# Patient Record
Sex: Female | Born: 1995
Health system: Southern US, Community
[De-identification: ages and names within clinical notes are randomized; demographics above are authoritative.]

## PROBLEM LIST (undated history)

## (undated) DIAGNOSIS — L309 Dermatitis, unspecified: Secondary | ICD-10-CM

## (undated) HISTORY — PX: TONSILLECTOMY: SUR1361

## (undated) HISTORY — DX: Dermatitis, unspecified: L30.9

---
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
## 2015-08-30 ENCOUNTER — Encounter (HOSPITAL_BASED_OUTPATIENT_CLINIC_OR_DEPARTMENT_OTHER): Payer: Self-pay | Admitting: *Deleted

## 2015-08-30 ENCOUNTER — Emergency Department (HOSPITAL_BASED_OUTPATIENT_CLINIC_OR_DEPARTMENT_OTHER)
Admission: EM | Admit: 2015-08-30 | Discharge: 2015-08-30 | Disposition: A | Discharge status: Home / Self Care | Payer: Commercial Managed Care - HMO | Attending: Emergency Medicine | Admitting: Emergency Medicine

## 2015-08-30 DIAGNOSIS — R112 Nausea with vomiting, unspecified: Secondary | ICD-10-CM | POA: Diagnosis not present

## 2015-08-30 DIAGNOSIS — R197 Diarrhea, unspecified: Secondary | ICD-10-CM | POA: Insufficient documentation

## 2015-08-30 DIAGNOSIS — K921 Melena: Secondary | ICD-10-CM | POA: Diagnosis present

## 2015-08-30 DIAGNOSIS — R195 Other fecal abnormalities: Secondary | ICD-10-CM | POA: Diagnosis not present

## 2015-08-30 LAB — OCCULT BLOOD X 1 CARD TO LAB, STOOL: FECAL OCCULT BLD: NEGATIVE

## 2015-08-30 MED ORDER — ONDANSETRON 8 MG PO TBDP
8.0000 mg | ORAL_TABLET | Freq: Once | ORAL | Status: AC
  Administered 2015-08-30: 8 mg via ORAL
  Filled 2015-08-30: qty 1

## 2015-08-30 NOTE — ED Notes (Addendum)
States she had diarrhea yesterday. Today she has had soft stools that look black. She vomiting 5 times yesterday but none today. Abdominal pain yesterday.

## 2015-08-30 NOTE — ED Provider Notes (Signed)
CSN: [id number]     Arrival date & time 08/30/15  1212 History   First MD Initiated Contact with Patient 08/30/15 1221     Chief Complaint  Patient presents with  . Blood In Stools     (Consider location/radiation/quality/duration/timing/severity/associated sxs/prior Treatment) HPI Hailey Beltran is a 19 y.o. female who presents to emergency department complaining of black stools. Patient states she had a GI virus yesterday, states she had nausea, vomiting, diarrhea, abdominal cramping. She states today her symptoms are improving. Stools are formed, soft, but appeared to be black. She went to urgent care and they told her she needed to go to emergency department because it may mean that she is bleeding. She states she is able to tolerate fluids today. She states her abdominal pain is gone today. She states overall she feels much better but is worried about her black stools. Patient does not take any iron supplements. She did take Pepto yesterday. She has no other medical problems.  History reviewed. No pertinent past medical history. Past Surgical History  Procedure Laterality Date  . Tonsillectomy     No family history on file. Social History  Substance Use Topics  . Smoking status: Never Smoker   . Smokeless tobacco: None  . Alcohol Use: Yes     Comment: occ   OB History    No data available     Review of Systems  Constitutional: Negative for fever and chills.  Respiratory: Negative for cough, chest tightness and shortness of breath.   Cardiovascular: Negative for chest pain, palpitations and leg swelling.  Gastrointestinal: Positive for nausea, vomiting, abdominal pain and diarrhea.  Musculoskeletal: Negative for myalgias, arthralgias, neck pain and neck stiffness.  Skin: Negative for rash.  Neurological: Negative for dizziness, weakness and headaches.  All other systems reviewed and are negative.     Allergies  Review of patient's allergies indicates no known  allergies.  Home Medications   Prior to Admission medications   Not on File   BP 123/87 mmHg  Pulse 87  Temp(Src) 98.1 F (36.7 C) (Oral)  Resp 20  Ht 5\' 1"  (1.549 m)  Wt 91.627 kg  BMI 38.19 kg/m2  SpO2 100%  LMP 08/02/2015 Physical Exam  Constitutional: She is oriented to person, place, and time. She appears well-developed and well-nourished. No distress.  HENT:  Head: Normocephalic.  Eyes: Conjunctivae are normal.  Neck: Neck supple.  Cardiovascular: Normal rate, regular rhythm and normal heart sounds.   Pulmonary/Chest: Effort normal and breath sounds normal. No respiratory distress. She has no wheezes. She has no rales.  Abdominal: Soft. Bowel sounds are normal. She exhibits no distension. There is no tenderness. There is no rebound.  Genitourinary: Guaiac negative stool.  Black stools  Musculoskeletal: She exhibits no edema.  Neurological: She is alert and oriented to person, place, and time.  Skin: Skin is warm and dry.  Psychiatric: She has a normal mood and affect. Her behavior is normal.  Nursing note and vitals reviewed.   ED Course  Procedures (including critical care time) Labs Review Labs Reviewed  OCCULT BLOOD X 1 CARD TO LAB, STOOL    Imaging Review No results found. I have personally reviewed and evaluated these images and lab results as part of my medical decision-making.   EKG Interpretation None      MDM   Final diagnoses:  Nausea vomiting and diarrhea  Black stools    patient emergency department with dark tarry stools. States she had viral  syndrome yesterday, however she is feeling much better today. Will check her stool for blood. zofran ordered.   1:18 PM Hemoccult negative. Black stools most likely from pepto bismal. Will dc home with outpatient follow up. Instructed to drink plenty of fluids. Return if worsening pain, bleeding, vomiting, fever, any new concerning symptom.   Filed Vitals:   08/30/15 1220 08/30/15 1231  BP:  128/86 123/87  Pulse: 85 87  Temp: 98.1 F (36.7 C)   TempSrc: Oral   Resp: 18 20  Height:  (1.549 m)   Weight: 91.627 kg   SpO2: 96% 100%     Jaynie Crumble, PA-C 08/30/15 2119  Raeford Razor, MD 09/02/15 [phone]

## 2015-08-30 NOTE — Discharge Instructions (Signed)
Drink plenty of fluids. Follow up with primary care doctor as needed. Return if fever, worsening abdominal pain, bright red blood in stool, vomiting, any new concerning symptom.   Norovirus Infection A norovirus infection is caused by exposure to a virus in a group of similar viruses (noroviruses). This type of infection causes inflammation in your stomach and intestines (gastroenteritis). Norovirus is the most common cause of gastroenteritis. It also causes food poisoning. Anyone can get a norovirus infection. It spreads very easily (contagious). You can get it from contaminated food, water, surfaces, or other people. Norovirus is found in the stool or vomit of infected people. You can spread the infection as soon as you feel sick until 2 weeks after you recover.  Symptoms usually begin within 2 days after you become infected. Most norovirus symptoms affect the digestive system. CAUSES Norovirus infection is caused by contact with norovirus. You can catch norovirus if you:  Eat or drink something contaminated with norovirus.  Touch surfaces or objects contaminated with norovirus and then put your hand in your mouth.  Have direct contact with an infected person who has symptoms.  Share food, drink, or utensils with someone with who is sick with norovirus. SIGNS AND SYMPTOMS Symptoms of norovirus may include:  Nausea.  Vomiting.  Diarrhea.  Stomach cramps.  Fever.  Chills.  Headache.  Muscle aches.  Tiredness. DIAGNOSIS Your health care provider may suspect norovirus based on your symptoms and physical exam. Your health care provider may also test a sample of your stool or vomit for the virus.  TREATMENT There is no specific treatment for norovirus. Most people get better without treatment in about 2 days. HOME CARE INSTRUCTIONS  Replace lost fluids by drinking plenty of water or rehydration fluids containing important minerals called electrolytes. This prevents dehydration.  Drink enough fluid to keep your urine clear or pale yellow.  Do not prepare food for others while you are infected. Wait at least 3 days after recovering from the illness to do that. PREVENTION   Wash your hands often, especially after using the toilet or changing a diaper.  Wash fruits and vegetables thoroughly before preparing or serving them.  Throw out any food that a sick person may have touched.  Disinfect contaminated surfaces immediately after someone in the household has been sick. Use a bleach-based household cleaner.  Immediately remove and wash soiled clothes or sheets. SEEK MEDICAL CARE IF:  Your vomiting, diarrhea, and stomach pain is getting worse.  Your symptoms of norovirus do not go away after 2-3 days. SEEK IMMEDIATE MEDICAL CARE IF:  You develop symptoms of dehydration that do not improve with fluid replacement. This may include:  Excessive sleepiness.  Lack of tears.  Dry mouth.  Dizziness when standing.  Weak pulse.   This information is not intended to replace advice given to you by your health care provider. Make sure you discuss any questions you have with your health care provider.   Document Released: 11/08/2002 Document Revised: 09/08/2014 Document Reviewed: 01/26/2014 Elsevier Interactive Patient Education [employer]

## 2016-12-05 ENCOUNTER — Other Ambulatory Visit (HOSPITAL_COMMUNITY)
Admission: RE | Admit: 2016-12-05 | Discharge: 2016-12-05 | Disposition: A | Discharge status: Home / Self Care | Payer: Commercial Managed Care - HMO | Source: Ambulatory Visit | Attending: Family Medicine | Admitting: Family Medicine

## 2016-12-05 ENCOUNTER — Other Ambulatory Visit: Payer: Self-pay | Admitting: Family Medicine

## 2016-12-05 DIAGNOSIS — Z01411 Encounter for gynecological examination (general) (routine) with abnormal findings: Secondary | ICD-10-CM | POA: Diagnosis not present

## 2016-12-05 DIAGNOSIS — Z01419 Encounter for gynecological examination (general) (routine) without abnormal findings: Secondary | ICD-10-CM | POA: Diagnosis not present

## 2016-12-05 DIAGNOSIS — Z13220 Encounter for screening for lipoid disorders: Secondary | ICD-10-CM | POA: Diagnosis not present

## 2016-12-05 DIAGNOSIS — Z131 Encounter for screening for diabetes mellitus: Secondary | ICD-10-CM | POA: Diagnosis not present

## 2016-12-05 DIAGNOSIS — Z23 Encounter for immunization: Secondary | ICD-10-CM | POA: Diagnosis not present

## 2016-12-10 LAB — CYTOLOGY - PAP: DIAGNOSIS: NEGATIVE

## 2017-05-24 DIAGNOSIS — Y999 Unspecified external cause status: Secondary | ICD-10-CM | POA: Insufficient documentation

## 2017-05-24 DIAGNOSIS — S46912A Strain of unspecified muscle, fascia and tendon at shoulder and upper arm level, left arm, initial encounter: Secondary | ICD-10-CM | POA: Diagnosis not present

## 2017-05-24 DIAGNOSIS — Y9289 Other specified places as the place of occurrence of the external cause: Secondary | ICD-10-CM | POA: Diagnosis not present

## 2017-05-24 DIAGNOSIS — Y9301 Activity, walking, marching and hiking: Secondary | ICD-10-CM | POA: Diagnosis not present

## 2017-05-24 DIAGNOSIS — M25512 Pain in left shoulder: Secondary | ICD-10-CM | POA: Diagnosis not present

## 2017-05-24 DIAGNOSIS — S4992XA Unspecified injury of left shoulder and upper arm, initial encounter: Secondary | ICD-10-CM | POA: Diagnosis not present

## 2017-05-24 DIAGNOSIS — X500XXA Overexertion from strenuous movement or load, initial encounter: Secondary | ICD-10-CM | POA: Diagnosis not present

## 2017-05-25 ENCOUNTER — Emergency Department (HOSPITAL_COMMUNITY)
Admission: EM | Admit: 2017-05-25 | Discharge: 2017-05-25 | Disposition: A | Discharge status: Home / Self Care | Payer: 59 | Attending: Emergency Medicine | Admitting: Emergency Medicine

## 2017-05-25 ENCOUNTER — Emergency Department (HOSPITAL_COMMUNITY): Payer: 59

## 2017-05-25 ENCOUNTER — Encounter (HOSPITAL_COMMUNITY): Payer: Self-pay | Admitting: *Deleted

## 2017-05-25 DIAGNOSIS — S46912A Strain of unspecified muscle, fascia and tendon at shoulder and upper arm level, left arm, initial encounter: Secondary | ICD-10-CM

## 2017-05-25 DIAGNOSIS — M25512 Pain in left shoulder: Secondary | ICD-10-CM | POA: Diagnosis not present

## 2017-05-25 MED ORDER — IBUPROFEN 200 MG PO TABS
600.0000 mg | ORAL_TABLET | Freq: Once | ORAL | Status: AC
  Administered 2017-05-25: 600 mg via ORAL
  Filled 2017-05-25: qty 3

## 2017-05-25 MED ORDER — IBUPROFEN 600 MG PO TABS: 600.0000 mg | ORAL_TABLET | Freq: Four times a day (QID) | ORAL | 0 refills | Status: DC | PRN

## 2017-05-25 NOTE — Discharge Instructions (Signed)
Follow up with your doctor if pain is no better in a week. Return here as needed.

## 2017-05-25 NOTE — ED Notes (Signed)
Bed: WTR8 Expected date:  Expected time:  Means of arrival:  Comments: 

## 2017-05-25 NOTE — ED Provider Notes (Signed)
[facility]-EMERGENCY DEPT Provider Note   CSN: [id number] Arrival date & time: 05/24/17  2128     History   Chief Complaint Chief Complaint  Patient presents with  . Shoulder Pain    left    HPI Hailey Beltran is a 21 y.o. female.  Left shoulder injury yesterday (05/23/17) while walking her dog. The dog pulled away from her suddenly, causing a pull on the shoulder. She has had pain in the joint and numbness in the arm since. No other injury. No perceived weakness of the left UE. No neck pain, chest pain, SOB.    The history is provided by the patient. No language interpreter was used.    History reviewed. No pertinent past medical history.  There are no active problems to display for this patient.   Past Surgical History:  Procedure Laterality Date  . TONSILLECTOMY      OB History    No data available       Home Medications    Prior to Admission medications   Not on File    Family History No family history on file.  Social History Social History  Substance Use Topics  . Smoking status: Never Smoker  . Smokeless tobacco: Never Used  . Alcohol use Yes     Comment: occ     Allergies   Patient has no known allergies.   Review of Systems Review of Systems  Respiratory: Negative.  Negative for shortness of breath.   Cardiovascular: Negative.  Negative for chest pain.  Gastrointestinal: Negative.  Negative for abdominal pain and nausea.  Musculoskeletal:       See HPI.  Skin: Negative.   Neurological: Positive for numbness.     Physical Exam Updated Vital Signs BP (!) 161/100 (BP Location: Left Arm)   Pulse 70   Temp 98.9 F (37.2 C) (Oral)   Resp 18   Ht  (1.549 m)   Wt 86.2 kg (190 lb)   LMP 05/24/2017 (Exact Date)   SpO2 99%   BMI 35.90 kg/m   Physical Exam  Constitutional: She is oriented to person, place, and time. She appears well-developed and well-nourished.  Neck: Normal range of motion.  Cardiovascular: Intact distal  pulses.   Pulmonary/Chest: Effort normal.  Musculoskeletal:  Left shoulder unremarkable in appearance. No swelling, deformity or discoloration. FROM. Focal tenderness to anterior deltoid area.   Neurological: She is alert and oriented to person, place, and time. A sensory deficit (Slight decrease in sensation to light touch of left arm when compared to right.) is present.  Skin: Skin is warm and dry.     ED Treatments / Results  Labs (all labs ordered are listed, but only abnormal results are displayed) Labs Reviewed - No data to display  EKG  EKG Interpretation None       Radiology Dg Shoulder Left  Result Date: 05/25/2017 CLINICAL DATA:  Left shoulder pain after her dog jerked the leash while they were walking. EXAM: LEFT SHOULDER - 2+ VIEW COMPARISON:  None. FINDINGS: There is no evidence of fracture or dislocation. There is no evidence of arthropathy or other focal bone abnormality. Soft tissues are unremarkable. IMPRESSION: No acute abnormality of the left shoulder. Electronically Signed   By: Deatra Robinson M.D.   On: 05/25/2017 00:47    Procedures Procedures (including critical care time)  Medications Ordered in ED Medications - No data to display   Initial Impression / Assessment and Plan / ED Course  I  have reviewed the triage vital signs and the nursing notes.  Pertinent labs & imaging results that were available during my care of the patient were reviewed by me and considered in my medical decision making (see chart for details).     Left shoulder strain injury after her dog pulled away from her while holding leash in left hand. Exam and history supports strain injury only.   Final Clinical Impressions(s) / ED Diagnoses   Final diagnoses:  None   1. Left shoulder strain  New Prescriptions New Prescriptions   No medications on file     Elpidio Anis, Cordelia Poche 05/25/17 Arlie Solomons, MD 05/25/17 [phone]

## 2017-05-25 NOTE — ED Triage Notes (Signed)
Pt reports that she was walking her dog earlier when the dog pulled on her left arm.  Pt reports something pop and now she feels numb in her arm. No obvious deformity noted in triage.  Pt reports limited movement d/t pain.  Pt has a +2 left radial pulse and a cap refill of less than 3 seconds. Pt a/o x 4 and ambulatory.

## 2017-06-24 DIAGNOSIS — B351 Tinea unguium: Secondary | ICD-10-CM | POA: Diagnosis not present

## 2017-11-29 DIAGNOSIS — J101 Influenza due to other identified influenza virus with other respiratory manifestations: Secondary | ICD-10-CM | POA: Diagnosis not present

## 2017-11-29 DIAGNOSIS — R05 Cough: Secondary | ICD-10-CM | POA: Diagnosis not present

## 2017-12-07 DIAGNOSIS — M940 Chondrocostal junction syndrome [Tietze]: Secondary | ICD-10-CM | POA: Diagnosis not present

## 2017-12-07 DIAGNOSIS — Z13220 Encounter for screening for lipoid disorders: Secondary | ICD-10-CM | POA: Diagnosis not present

## 2017-12-07 DIAGNOSIS — Z0000 Encounter for general adult medical examination without abnormal findings: Secondary | ICD-10-CM | POA: Diagnosis not present

## 2018-03-30 DIAGNOSIS — Z3202 Encounter for pregnancy test, result negative: Secondary | ICD-10-CM | POA: Diagnosis not present

## 2018-03-30 DIAGNOSIS — J309 Allergic rhinitis, unspecified: Secondary | ICD-10-CM | POA: Diagnosis not present

## 2018-03-30 DIAGNOSIS — K219 Gastro-esophageal reflux disease without esophagitis: Secondary | ICD-10-CM | POA: Diagnosis not present

## 2018-11-02 ENCOUNTER — Other Ambulatory Visit: Payer: Self-pay

## 2018-11-02 ENCOUNTER — Encounter (HOSPITAL_COMMUNITY): Payer: Self-pay | Admitting: Emergency Medicine

## 2018-11-02 ENCOUNTER — Emergency Department (HOSPITAL_COMMUNITY)
Admission: EM | Admit: 2018-11-02 | Discharge: 2018-11-02 | Disposition: A | Discharge status: Home / Self Care | Payer: 59 | Attending: Emergency Medicine | Admitting: Emergency Medicine

## 2018-11-02 DIAGNOSIS — N938 Other specified abnormal uterine and vaginal bleeding: Secondary | ICD-10-CM | POA: Insufficient documentation

## 2018-11-02 DIAGNOSIS — R102 Pelvic and perineal pain: Secondary | ICD-10-CM | POA: Insufficient documentation

## 2018-11-02 DIAGNOSIS — N939 Abnormal uterine and vaginal bleeding, unspecified: Secondary | ICD-10-CM

## 2018-11-02 DIAGNOSIS — Z3202 Encounter for pregnancy test, result negative: Secondary | ICD-10-CM | POA: Diagnosis not present

## 2018-11-02 LAB — CBC WITH DIFFERENTIAL/PLATELET
ABS IMMATURE GRANULOCYTES: 0.03 10*3/uL (ref 0.00–0.07)
Basophils Absolute: 0 10*3/uL (ref 0.0–0.1)
Basophils Relative: 0 %
EOS PCT: 1 %
Eosinophils Absolute: 0.1 10*3/uL (ref 0.0–0.5)
HEMATOCRIT: 41.2 % (ref 36.0–46.0)
HEMOGLOBIN: 12.8 g/dL (ref 12.0–15.0)
Immature Granulocytes: 0 %
LYMPHS ABS: 3.6 10*3/uL (ref 0.7–4.0)
LYMPHS PCT: 30 %
MCH: 27.2 pg (ref 26.0–34.0)
MCHC: 31.1 g/dL (ref 30.0–36.0)
MCV: 87.5 fL (ref 80.0–100.0)
MONO ABS: 0.6 10*3/uL (ref 0.1–1.0)
MONOS PCT: 5 %
NEUTROS ABS: 7.8 10*3/uL — AB (ref 1.7–7.7)
Neutrophils Relative %: 64 %
PLATELETS: 308 10*3/uL (ref 150–400)
RBC: 4.71 MIL/uL (ref 3.87–5.11)
RDW: 13.6 % (ref 11.5–15.5)
WBC: 12.1 10*3/uL — AB (ref 4.0–10.5)
nRBC: 0 % (ref 0.0–0.2)

## 2018-11-02 LAB — POC URINE PREG, ED: Preg Test, Ur: NEGATIVE

## 2018-11-02 LAB — I-STAT BETA HCG BLOOD, ED (MC, WL, AP ONLY)

## 2018-11-02 MED ORDER — HYDROCODONE-ACETAMINOPHEN 5-325 MG PO TABS: 1.0000 | ORAL_TABLET | ORAL | 0 refills | Status: DC | PRN

## 2018-11-02 MED ORDER — HYDROCODONE-ACETAMINOPHEN 5-325 MG PO TABS
1.0000 | ORAL_TABLET | Freq: Once | ORAL | Status: AC
  Administered 2018-11-02: 1 via ORAL
  Filled 2018-11-02: qty 1

## 2018-11-02 NOTE — ED Notes (Signed)
Urine sample and urine culture sent to lab PRN

## 2018-11-02 NOTE — ED Triage Notes (Signed)
PT BIB POV c/o abdominal pain and vaginal bleeding x2 days. Pt stated that she will go through a super sized tampon in 30 minutes. Pt endorses nausea also. Cramping in abdomen is worse then with a normal cycle. Blood is dark in color and clots are present.

## 2018-11-02 NOTE — Discharge Instructions (Signed)
The testing today is reassuring.  Continue to take an anti-inflammatory like ibuprofen.  We are prescribing a narcotic pain reliever.  Do not drive when taking the narcotic pain reliever.

## 2018-11-02 NOTE — ED Provider Notes (Signed)
[facility]-EMERGENCY DEPT Provider Note   CSN: [id number] Arrival date & time: 11/02/18  1751    History   Chief Complaint Chief Complaint  Patient presents with  . Abdominal Pain  . Vaginal Bleeding    HPI Hailey Beltran is a 23 y.o. female.     HPI   She presents for evaluation of heavy menses, which started yesterday.  She is taking Tylenol, ibuprofen, and Midol, all without relief of her cramping abdominal pain.  She states her last period, 1 month ago, was lighter than usual and she had some spotting throughout the last month.  She does not think that she is pregnant.  She denies weakness, dizziness, fever, chills, nausea or vomiting.  There are no other known modifying factors.  History reviewed. No pertinent past medical history.  There are no active problems to display for this patient.   Past Surgical History:  Procedure Laterality Date  . TONSILLECTOMY       OB History   No obstetric history on file.      Home Medications    Prior to Admission medications   Medication Sig Start Date End Date Taking? Authorizing Provider  ibuprofen (ADVIL,MOTRIN) 200 MG tablet Take 400 mg by mouth 2 (two) times daily as needed for cramping.   Yes [provider]  Multiple Vitamins-Calcium (ONE-A-DAY WITHIN PO) Take 1 tablet by mouth daily.   Yes [provider]  HYDROcodone-acetaminophen (NORCO/VICODIN) 5-325 MG tablet Take 1 tablet by mouth every 4 (four) hours as needed for moderate pain. 11/02/18   Mancel Bale, MD  ibuprofen (ADVIL,MOTRIN) 600 MG tablet Take 1 tablet (600 mg total) by mouth every 6 (six) hours as needed. Patient not taking: Reported on 11/02/2018 05/25/17   Elpidio Anis, PA-C    Family History History reviewed. No pertinent family history.  Social History Social History   Tobacco Use  . Smoking status: Never Smoker  . Smokeless tobacco: Never Used  Substance Use Topics  . Alcohol use: Yes    Comment:  occ  . Drug use: No     Allergies   Patient has no known allergies.   Review of Systems Review of Systems  All other systems reviewed and are negative.    Physical Exam Updated Vital Signs BP (!) 145/89 (BP Location: Right Arm)   Pulse 83   Temp 98.2 F (36.8 C) (Oral)   Resp 18   Ht  (1.549 m)   Wt 99.8 kg   SpO2 99%   BMI 41.57 kg/m   Physical Exam Vitals signs and nursing note reviewed.  Constitutional:      General: She is not in acute distress.    Appearance: She is well-developed. She is not ill-appearing, toxic-appearing or diaphoretic.  HENT:     Head: Normocephalic and atraumatic.     Right Ear: External ear normal.     Left Ear: External ear normal.     Mouth/Throat:     Mouth: Mucous membranes are moist.  Eyes:     Conjunctiva/sclera: Conjunctivae normal.     Pupils: Pupils are equal, round, and reactive to light.  Neck:     Musculoskeletal: Normal range of motion and neck supple.     Trachea: Phonation normal.  Cardiovascular:     Rate and Rhythm: Normal rate and regular rhythm.     Heart sounds: Normal heart sounds.  Pulmonary:     Effort: Pulmonary effort is normal.     Breath  sounds: Normal breath sounds.  Abdominal:     General: There is no distension.     Palpations: Abdomen is soft. There is no mass.     Tenderness: There is abdominal tenderness (Pelvic, mild). There is no rebound.     Hernia: No hernia is present.  Musculoskeletal: Normal range of motion.  Skin:    General: Skin is warm and dry.  Neurological:     Mental Status: She is alert and oriented to person, place, and time.     Cranial Nerves: No cranial nerve deficit.     Sensory: No sensory deficit.     Motor: No abnormal muscle tone.     Coordination: Coordination normal.  Psychiatric:        Mood and Affect: Mood normal.        Behavior: Behavior normal.        Thought Content: Thought content normal.        Judgment: Judgment normal.      ED Treatments /  Results  Labs (all labs ordered are listed, but only abnormal results are displayed) Labs Reviewed  CBC WITH DIFFERENTIAL/PLATELET - Abnormal; Notable for the following components:      Result Value   WBC 12.1 (*)    Neutro Abs 7.8 (*)    All other components within normal limits  I-STAT BETA HCG BLOOD, ED ([facility], [facility], [facility] ONLY)  POC URINE PREG, ED  I-STAT BETA HCG BLOOD, ED ([facility], [facility], [facility] ONLY)    EKG None  Radiology No results found.  Procedures Procedures (including critical care time)  Medications Ordered in ED Medications  HYDROcodone-acetaminophen (NORCO/VICODIN) 5-325 MG per tablet 1 tablet (has no administration in time range)     Initial Impression / Assessment and Plan / ED Course  I have reviewed the triage vital signs and the nursing notes.  Pertinent labs & imaging results that were available during my care of the patient were reviewed by me and considered in my medical decision making (see chart for details).         Patient Vitals for the past 24 hrs:  BP Temp Temp src Pulse Resp SpO2 Height Weight  11/02/18 2054 (!) 145/89 - - 83 18 99 % - -  11/02/18 1820 (!) 144/96 98.2 F (36.8 C) Oral 78 14 97 % 5\' 1"  (1.549 m) 99.8 kg    9:59 PM Reevaluation with update and discussion. After initial assessment and treatment, an updated evaluation reveals no further complaints, findings discussed with the patient and a female friend with her, all questions were answered. Mancel Bale   Medical Decision Making: Eval. C/w dysmenorrhea. Not pregnant. Hemodynamically stable and reassuring Hemoglobin.  CRITICAL CARE- No Performed by: Mancel Bale  Nursing Notes Reviewed/ Care Coordinated Applicable Imaging Reviewed Interpretation of Laboratory Data incorporated into ED treatment  The patient appears reasonably screened and/or stabilized for discharge and I doubt any other medical condition or other [facility] requiring further screening, evaluation, or treatment in the ED at  this time prior to discharge.  Plan: Home Medications- OTC prn; Home Treatments- rest, fluids; return here if the recommended treatment, does not improve the symptoms; Recommended follow up- PCP/GYN prn   Final Clinical Impressions(s) / ED Diagnoses   Final diagnoses:  Vaginal bleeding    ED Discharge Orders         Ordered    HYDROcodone-acetaminophen (NORCO/VICODIN) 5-325 MG tablet  Every 4 hours PRN     11/02/18 2157  Mancel Bale, MD 11/03/18 1116

## 2019-10-21 ENCOUNTER — Encounter (HOSPITAL_COMMUNITY): Payer: Self-pay | Admitting: Emergency Medicine

## 2019-10-21 ENCOUNTER — Emergency Department (HOSPITAL_COMMUNITY)
Admission: EM | Admit: 2019-10-21 | Discharge: 2019-10-21 | Disposition: A | Discharge status: Home / Self Care | Payer: 59 | Attending: Emergency Medicine | Admitting: Emergency Medicine

## 2019-10-21 ENCOUNTER — Other Ambulatory Visit: Payer: Self-pay

## 2019-10-21 DIAGNOSIS — L299 Pruritus, unspecified: Secondary | ICD-10-CM | POA: Diagnosis present

## 2019-10-21 DIAGNOSIS — L249 Irritant contact dermatitis, unspecified cause: Secondary | ICD-10-CM | POA: Insufficient documentation

## 2019-10-21 MED ORDER — FAMOTIDINE 20 MG PO TABS: 20.0000 mg | ORAL_TABLET | Freq: Two times a day (BID) | ORAL | 0 refills | Status: DC

## 2019-10-21 NOTE — ED Provider Notes (Signed)
[facility]-EMERGENCY DEPT Provider Note   CSN: [id number] Arrival date & time: 10/21/19  2043     History Chief Complaint  Patient presents with  . Finger Injury    Hailey Beltran is a 24 y.o. female.  HPI Patient presents to the emergency department with irritation to the left index finger that started 2 hours ago.  The patient states that she was at work when she noticed that after taking off her glove.  Patient states that the area itched and there is some swelling noted to the area.  The areas on the palmar aspect of the finger.  Patient does not recall any contact with any chemicals or other substance that would cause this.    History reviewed. No pertinent past medical history.  There are no problems to display for this patient.   Past Surgical History:  Procedure Laterality Date  . TONSILLECTOMY       OB History   No obstetric history on file.     No family history on file.  Social History   Tobacco Use  . Smoking status: Never Smoker  . Smokeless tobacco: Never Used  Substance Use Topics  . Alcohol use: Yes    Comment: occ  . Drug use: No    Home Medications Prior to Admission medications   Medication Sig Start Date End Date Taking? Authorizing Provider  HYDROcodone-acetaminophen (NORCO/VICODIN) 5-325 MG tablet Take 1 tablet by mouth every 4 (four) hours as needed for moderate pain. 11/02/18   Mancel Bale, MD  ibuprofen (ADVIL,MOTRIN) 200 MG tablet Take 400 mg by mouth 2 (two) times daily as needed for cramping.    [provider]  ibuprofen (ADVIL,MOTRIN) 600 MG tablet Take 1 tablet (600 mg total) by mouth every 6 (six) hours as needed. Patient not taking: Reported on 11/02/2018 05/25/17   Elpidio Anis, PA-C  Multiple Vitamins-Calcium (ONE-A-DAY WITHIN PO) Take 1 tablet by mouth daily.    [provider]    Allergies    Patient has no known allergies.  Review of Systems   Review of Systems All other  systems negative except as documented in the HPI. All pertinent positives and negatives as reviewed in the HPI. Physical Exam Updated Vital Signs BP (!) 166/99   Pulse 93   Temp 98.8 F (37.1 C) (Oral)   Resp 18   SpO2 98%   Physical Exam Vitals and nursing note reviewed.  Constitutional:      General: She is not in acute distress.    Appearance: She is well-developed.  HENT:     Head: Normocephalic and atraumatic.  Eyes:     Pupils: Pupils are equal, round, and reactive to light.  Pulmonary:     Effort: Pulmonary effort is normal.  Musculoskeletal:       Hands:  Skin:    General: Skin is warm and dry.  Neurological:     Mental Status: She is alert and oriented to person, place, and time.     ED Results / Procedures / Treatments   Labs (all labs ordered are listed, but only abnormal results are displayed) Labs Reviewed - No data to display  EKG None  Radiology No results found.  Procedures Procedures (including critical care time)  Medications Ordered in ED Medications - No data to display  ED Course  I have reviewed the triage vital signs and the nursing notes.  Pertinent labs & imaging results that were available during my care of the  patient were reviewed by me and considered in my medical decision making (see chart for details).    MDM Rules/Calculators/A&P                      The patient's itching along with the appearance of the finger this appears to be a contact type dermatitis.  I advised the patient at this point that is what we will work with him that if there is a change or worsening in her condition she will need to return here or follow-up with her primary care doctor. Final Clinical Impression(s) / ED Diagnoses Final diagnoses:  None    Rx / DC Orders ED Discharge Orders    None       Rebeca Allegra 10/21/19 2201    Hayden Rasmussen, MD 10/22/19 1032

## 2019-10-21 NOTE — Discharge Instructions (Addendum)
Return here for any worsening in your condition.  You can take Benadryl for any itching.

## 2019-10-21 NOTE — ED Triage Notes (Signed)
Patient reports swelling to left index finger x2 hours. ROM present.

## 2019-12-15 ENCOUNTER — Other Ambulatory Visit (HOSPITAL_COMMUNITY)
Admission: RE | Admit: 2019-12-15 | Discharge: 2019-12-15 | Disposition: A | Discharge status: Home / Self Care | Payer: BC Managed Care – PPO | Source: Ambulatory Visit | Attending: Family Medicine | Admitting: Family Medicine

## 2019-12-15 ENCOUNTER — Other Ambulatory Visit: Payer: Self-pay | Admitting: Family Medicine

## 2019-12-15 DIAGNOSIS — R079 Chest pain, unspecified: Secondary | ICD-10-CM | POA: Diagnosis not present

## 2019-12-15 DIAGNOSIS — Z0000 Encounter for general adult medical examination without abnormal findings: Secondary | ICD-10-CM | POA: Diagnosis not present

## 2019-12-15 DIAGNOSIS — Z113 Encounter for screening for infections with a predominantly sexual mode of transmission: Secondary | ICD-10-CM | POA: Diagnosis not present

## 2019-12-15 DIAGNOSIS — Z124 Encounter for screening for malignant neoplasm of cervix: Secondary | ICD-10-CM | POA: Insufficient documentation

## 2019-12-15 DIAGNOSIS — Z1159 Encounter for screening for other viral diseases: Secondary | ICD-10-CM | POA: Diagnosis not present

## 2019-12-15 DIAGNOSIS — Z13220 Encounter for screening for lipoid disorders: Secondary | ICD-10-CM | POA: Diagnosis not present

## 2019-12-16 ENCOUNTER — Ambulatory Visit
Admission: RE | Admit: 2019-12-16 | Discharge: 2019-12-16 | Disposition: A | Discharge status: Home / Self Care | Payer: BC Managed Care – PPO | Source: Ambulatory Visit | Attending: Family Medicine | Admitting: Family Medicine

## 2019-12-16 ENCOUNTER — Other Ambulatory Visit: Payer: Self-pay

## 2019-12-16 ENCOUNTER — Other Ambulatory Visit: Payer: Self-pay | Admitting: Family Medicine

## 2019-12-16 DIAGNOSIS — R079 Chest pain, unspecified: Secondary | ICD-10-CM | POA: Diagnosis not present

## 2019-12-29 LAB — CYTOLOGY - PAP
Comment: NEGATIVE
Diagnosis: UNDETERMINED — AB
High risk HPV: NEGATIVE

## 2020-01-15 DIAGNOSIS — H6692 Otitis media, unspecified, left ear: Secondary | ICD-10-CM | POA: Diagnosis not present

## 2020-02-21 DIAGNOSIS — M79641 Pain in right hand: Secondary | ICD-10-CM | POA: Diagnosis not present

## 2020-03-03 DIAGNOSIS — R21 Rash and other nonspecific skin eruption: Secondary | ICD-10-CM | POA: Diagnosis not present

## 2020-03-15 DIAGNOSIS — M79641 Pain in right hand: Secondary | ICD-10-CM | POA: Diagnosis not present

## 2020-03-15 DIAGNOSIS — M25531 Pain in right wrist: Secondary | ICD-10-CM | POA: Diagnosis not present

## 2020-03-28 DIAGNOSIS — M79641 Pain in right hand: Secondary | ICD-10-CM | POA: Diagnosis not present

## 2020-04-05 DIAGNOSIS — M79641 Pain in right hand: Secondary | ICD-10-CM | POA: Diagnosis not present

## 2020-06-01 DIAGNOSIS — G5601 Carpal tunnel syndrome, right upper limb: Secondary | ICD-10-CM | POA: Diagnosis not present

## 2020-06-11 DIAGNOSIS — R200 Anesthesia of skin: Secondary | ICD-10-CM | POA: Diagnosis not present

## 2020-06-19 DIAGNOSIS — M79641 Pain in right hand: Secondary | ICD-10-CM | POA: Diagnosis not present

## 2020-06-22 DIAGNOSIS — H6502 Acute serous otitis media, left ear: Secondary | ICD-10-CM | POA: Diagnosis not present

## 2020-06-27 DIAGNOSIS — H9203 Otalgia, bilateral: Secondary | ICD-10-CM | POA: Diagnosis not present

## 2020-06-27 DIAGNOSIS — Z20822 Contact with and (suspected) exposure to covid-19: Secondary | ICD-10-CM | POA: Diagnosis not present

## 2020-06-27 DIAGNOSIS — R5383 Other fatigue: Secondary | ICD-10-CM | POA: Diagnosis not present

## 2020-06-27 DIAGNOSIS — R0981 Nasal congestion: Secondary | ICD-10-CM | POA: Diagnosis not present

## 2020-07-04 DIAGNOSIS — J0111 Acute recurrent frontal sinusitis: Secondary | ICD-10-CM | POA: Diagnosis not present

## 2020-07-04 DIAGNOSIS — H6982 Other specified disorders of Eustachian tube, left ear: Secondary | ICD-10-CM | POA: Diagnosis not present

## 2020-07-05 DIAGNOSIS — R112 Nausea with vomiting, unspecified: Secondary | ICD-10-CM | POA: Diagnosis not present

## 2020-09-14 DIAGNOSIS — J029 Acute pharyngitis, unspecified: Secondary | ICD-10-CM | POA: Diagnosis not present

## 2020-09-14 DIAGNOSIS — U071 COVID-19: Secondary | ICD-10-CM | POA: Diagnosis not present

## 2020-12-26 ENCOUNTER — Other Ambulatory Visit: Payer: Self-pay | Admitting: Family Medicine

## 2020-12-26 ENCOUNTER — Other Ambulatory Visit (HOSPITAL_COMMUNITY)
Admission: RE | Admit: 2020-12-26 | Discharge: 2020-12-26 | Disposition: A | Discharge status: Home / Self Care | Payer: BC Managed Care – PPO | Source: Ambulatory Visit | Attending: Family Medicine | Admitting: Family Medicine

## 2020-12-26 DIAGNOSIS — Z113 Encounter for screening for infections with a predominantly sexual mode of transmission: Secondary | ICD-10-CM | POA: Diagnosis not present

## 2020-12-26 DIAGNOSIS — Z124 Encounter for screening for malignant neoplasm of cervix: Secondary | ICD-10-CM | POA: Insufficient documentation

## 2020-12-26 DIAGNOSIS — Z13220 Encounter for screening for lipoid disorders: Secondary | ICD-10-CM | POA: Diagnosis not present

## 2020-12-26 DIAGNOSIS — Z8742 Personal history of other diseases of the female genital tract: Secondary | ICD-10-CM | POA: Insufficient documentation

## 2020-12-26 DIAGNOSIS — Z0000 Encounter for general adult medical examination without abnormal findings: Secondary | ICD-10-CM | POA: Diagnosis not present

## 2021-01-01 LAB — CYTOLOGY - PAP
Comment: NEGATIVE
Diagnosis: NEGATIVE
High risk HPV: NEGATIVE

## 2021-01-29 DIAGNOSIS — H6981 Other specified disorders of Eustachian tube, right ear: Secondary | ICD-10-CM | POA: Diagnosis not present

## 2021-01-29 DIAGNOSIS — J309 Allergic rhinitis, unspecified: Secondary | ICD-10-CM | POA: Diagnosis not present

## 2021-02-07 DIAGNOSIS — J302 Other seasonal allergic rhinitis: Secondary | ICD-10-CM | POA: Diagnosis not present

## 2021-02-07 DIAGNOSIS — H9319 Tinnitus, unspecified ear: Secondary | ICD-10-CM | POA: Diagnosis not present

## 2021-02-07 DIAGNOSIS — H6981 Other specified disorders of Eustachian tube, right ear: Secondary | ICD-10-CM | POA: Diagnosis not present

## 2021-02-07 DIAGNOSIS — H8111 Benign paroxysmal vertigo, right ear: Secondary | ICD-10-CM | POA: Diagnosis not present

## 2021-03-05 NOTE — Progress Notes (Signed)
New Patient Note  RE: Hailey Beltran MRN: [id number] DOB: 09-29-1995 Date of Office Visit: 03/06/2021  Consult requested by: Aliene Beams, MD Primary care provider: Aliene Beams, MD  Chief Complaint: Allergy Testing (Updated possible foods and bad seasonal allergies )  History of Present Illness: I had the pleasure of seeing Hailey Beltran for initial evaluation at the [facility] on 03/07/2021. She is a 25 y.o. female, who is referred here by Aliene Beams, MD for the evaluation of peanut allergy and seasonal allergies.  Food: She reports food allergy to tree nuts. The reaction occurred in high school after she ate few handful of pecans. Symptoms started within minutes and was in the form of perioral pruritus, throat tightness. Denies any hives, swelling, wheezing, abdominal pain, diarrhea, vomiting. Denies any associated cofactors such as exertion, infection, NSAID use, or alcohol consumption. The symptoms lasted for unknown amount of time. She was not evaluated in ED.  Patient had pecans before but has been avoiding them since then.   Since this episode, she does not report other accidental exposures to tree nuts. She does have access to epinephrine autoinjector and not needed to use it.   Past work up includes: none. Dietary History: patient has been eating other foods including milk, eggs, peanut, cashews, sesame, shellfish, fish, limited soy, wheat, meats, fruits and vegetables.  Watermelon and bananas cause some perioral pruritus.   She reports reading labels and avoiding tree nuts in diet completely. She had peanuts a few weeks ago with no issues.   Rhinitis: She reports symptoms of sneezing, watery/itchy eyes, nasal congestion, rhinorrhea. Symptoms have been going on for 10+ years. The symptoms are present mainly in the spring. Anosmia: no. Headache: no. She has used loratadine, zyrtec, mometasone with some improvement in symptoms. Sinus  infections: no. Previous work up includes: none. Previous ENT evaluation: not yet. Previous sinus imaging: no. History of nasal polyps: no. Last eye exam: last year. History of reflux: no.  Assessment and Plan: Hailey Beltran is a 25 y.o. female with: Other adverse food reactions, not elsewhere classified, subsequent encounter Reaction to pecans many years ago in the form of perioral pruritus and throat tightness.  Took Benadryl at home and symptoms resolved.  Patient tolerates cashews and peanuts.  She had peanuts a few weeks ago with no issues.  Watermelon and bananas caused some perioral pruritus.  No prior work-up. Today's skin testing showed: Positive to pecan, almond and borderline to hazelnut. Negative to peanuts, banana and watermelon. Discussed with patient the difference between peanuts and tree nuts and she confirmed that she has been eating peanuts with no issues.  Continue to avoid tree nuts Walnuts and pecans cross react. Okay to eat peanuts and cashews as before - be careful about cross contamination.  For mild symptoms you can take over the counter antihistamines such as Benadryl and monitor symptoms closely. If symptoms worsen or if you have severe symptoms including breathing issues, throat closure, significant swelling, whole body hives, severe diarrhea and vomiting, lightheadedness then inject epinephrine and seek immediate medical care afterwards. Action plan given.   Oral allergy syndrome, subsequent encounter Perioral pruritus with watermelon and bananas. Today's skin prick testing was negative to bananas and watermelon. Continue to avoid fresh bananas and fresh watermelon.  Discussed that her food triggered oral and throat symptoms are likely caused by oral food allergy syndrome (OFAS). This is caused by cross reactivity of pollen with fresh fruits and  vegetables, and nuts. Symptoms are usually localized in the form of itching and burning in mouth and throat. Very rarely it can  progress to more severe symptoms. Eating foods in cooked or processed forms usually minimizes symptoms. I recommended avoidance of eating the problem foods, especially during the peak season(s). Sometimes, OFAS can induce severe throat swelling or even a systemic reaction; with such instance, I advised them to report to a local ER. A list of common pollens and food cross-reactivities was provided to the patient.   Other allergic rhinitis Rhinoconjunctivitis symptoms mainly in the spring for 10+ years.  Tried loratadine, Zyrtec and mometasone with good benefit.  No prior allergy evaluation. Today's skin testing showed: Positive to grass, weed, ragweed, trees, mold, dust mites, cat, dog, horse.  Start environmental control measures as below. Use over the counter antihistamines such as Zyrtec (cetirizine), Claritin (loratadine), Allegra (fexofenadine), or Xyzal (levocetirizine) daily as needed. May take twice a day during allergy flares. May switch antihistamines every few months. Use Nasonex (mometasone) nasal spray 1 spray per nostril twice a day as needed for nasal congestion.  Nasal saline spray (i.e., Simply Saline) or nasal saline lavage (i.e., NeilMed) is recommended as needed and prior to medicated nasal sprays. Declines prescription eyedrops. Consider allergy injections for long term control if above medications do not help the symptoms - handout given.   Allergic conjunctivitis of both eyes See assessment and plan as above.  Return in about 9 months (around 12/05/2021).  No orders of the defined types were placed in this encounter.  Lab Orders  No laboratory test(s) ordered today    Other allergy screening: Asthma: no Medication allergy: no Hymenoptera allergy: no Urticaria: no Eczema:improved History of recurrent infections suggestive of immunodeficency: no  Diagnostics: Positive to grass, weed, ragweed, trees, mold, dust mites, cat, dog, horse.  Positive to pecan, almond and  borderline to hazelnut. Negative to peanuts, banana and watermelon. Results discussed with patient/family.  Airborne Adult Perc - 03/06/21 1023     Time Antigen Placed 1020    Allergen Manufacturer Waynette Buttery    Location Back    Number of Test 59    1. Control-Buffer 50% Glycerol Negative    2. Control-Histamine 1 mg/ml 2+    3. Albumin saline Negative    4. Bahia 3+    5. [place] 4+    6. Johnson 2+    7. Kentucky Blue Negative    8. Meadow Fescue 3+    9. Perennial Rye 2+    10. Sweet Vernal Negative    11. Timothy 3+    13. Burweed Marshelder Negative    14. Ragweed, short Negative    15. Ragweed, Giant Negative    16. Plantain,  English 2+    17. Lamb's Quarters Negative    18. Sheep Sorrell 2+    19. Rough Pigweed Negative    20. Marsh Elder, Rough Negative    21. Mugwort, Common Negative    22. Ash mix 2+    23. Birch mix 4+    24. Beech American 3+    25. Box, Elder 2+    26. Cedar, red 2+    27. Cottonwood, Eastern 3+    28. Elm mix 3+    29. Hickory 3+    30. Maple mix 2+    31. Oak, Guinea-Bissau mix 2+    32. Pecan Pollen 2+    33. Pine mix --   +/-   34. Sycamore Eastern 2+  35. Walnut, Black Pollen Negative    36. Alternaria alternata Negative    37. Cladosporium Herbarum Negative    38. Aspergillus mix Negative    39. Penicillium mix Negative    40. Bipolaris sorokiniana (Helminthosporium) Negative    41. Drechslera spicifera (Curvularia) Negative    42. Mucor plumbeus Negative    43. Fusarium moniliforme Negative    44. Aureobasidium pullulans (pullulara) Negative    45. Rhizopus oryzae Negative    46. Botrytis cinera Negative    47. Epicoccum nigrum Negative    48. Phoma betae Negative    49. Candida Albicans Negative    50. Trichophyton mentagrophytes Negative    51. Mite, D Farinae  5,000 AU/ml Negative    52. Mite, D Pteronyssinus  5,000 AU/ml Negative    53. Cat Hair 10,000 BAU/ml 2+    54.  Dog Epithelia 2+    55. Mixed Feathers Negative     56. Horse Epithelia 2+    57. Cockroach, German Negative    58. Mouse Negative    59. Tobacco Leaf Negative             Intradermal - 03/06/21 1045     Time Antigen Placed 1045    Allergen Manufacturer Greer    Location Arm    Number of Test 8    Control Negative    Ragweed mix 3+    Mold 1 2+    Mold 2 2+    Mold 3 2+    Mold 4 2+    Cockroach Negative    Mite mix 3+             Food Adult Perc - 03/06/21 1000     Time Antigen Placed 1020    Allergen Manufacturer Greer    Location Back    Number of allergen test 11    1. Peanut Negative    10. Cashew Negative    11. Pecan Food --   3x3   12. Walnut Food Negative    13. Almond --   3x3   14. Hazelnut --   +/-   15. Eston[place] nut Negative    16. Coconut Negative    17. Pistachio Negative    57. Banana Negative    62. Watermelon Negative             Past Medical History: Patient Active Problem List   Diagnosis Date Noted   Other adverse food reactions, not elsewhere classified, subsequent encounter 03/07/2021   Other allergic rhinitis 03/07/2021   Oral allergy syndrome, subsequent encounter 03/07/2021   Allergic conjunctivitis of both eyes 03/07/2021    Past Medical History:  Diagnosis Date   Eczema    Past Surgical History: Past Surgical History:  Procedure Laterality Date   TONSILLECTOMY     Medication List:  Current Outpatient Medications  Medication Sig Dispense Refill   EPINEPHrine 0.3 mg/0.3 mL IJ SOAJ injection Inject 0.3 mg into the muscle once.     ferrous sulfate 325 (65 FE) MG EC tablet Take 1 tablet by mouth 2 (two) times daily.     loratadine (CLARITIN) 10 MG tablet Take 10 mg by mouth daily.     No current facility-administered medications for this visit.   Allergies: Allergies  Allergen Reactions   Almond (Diagnostic)    Pecan Nut (Diagnostic)    Social History: Social History   Socioeconomic History   Marital status: Single    Spouse name: Not on file  Number of  children: Not on file   Years of education: Not on file   Highest education level: Not on file  Occupational History   Not on file  Tobacco Use   Smoking status: Never   Smokeless tobacco: Never  Vaping Use   Vaping Use: Never used  Substance and Sexual Activity   Alcohol use: Yes    Comment: occ   Drug use: No   Sexual activity: Not on file  Other Topics Concern   Not on file  Social History Narrative   Not on file   Social Determinants of Health   Financial Resource Strain: Not on file  Food Insecurity: Not on file  Transportation Needs: Not on file  Physical Activity: Not on file  Stress: Not on file  Social Connections: Not on file   Lives in an apartment. Smoking: denies Occupation: Radiation protection practitioner  Environmental History: Water Damage/mildew in the house: yes Carpet in the family room: yes Carpet in the bedroom: yes Heating: electric Cooling: central Pet: yes 2 dogs x 4 yrs, 3 yrs  Family History: Family History  Problem Relation Age of Onset   Allergic rhinitis Mother    Asthma Mother    Atopy Neg Hx    Eczema Neg Hx    Urticaria Neg Hx    Angioedema Neg Hx    Immunodeficiency Neg Hx    Review of Systems  Constitutional:  Negative for appetite change, chills, fever and unexpected weight change.  HENT:  Negative for congestion and rhinorrhea.   Eyes:  Negative for itching.  Respiratory:  Negative for cough, chest tightness, shortness of breath and wheezing.   Cardiovascular:  Negative for chest pain.  Gastrointestinal:  Negative for abdominal pain.  Genitourinary:  Negative for difficulty urinating.  Skin:  Positive for rash.  Allergic/Immunologic: Positive for environmental allergies and food allergies.  Neurological:  Negative for headaches.   Objective: BP 110/82   Pulse (!) 102   Temp 98.1 F (36.7 C) (Temporal)   Resp 16   Ht  (1.549 m)   Wt 236 lb 6.4 oz (107.2 kg)   SpO2 98%   BMI 44.67 kg/m  Body mass index is 44.67  kg/m. Physical Exam Vitals and nursing note reviewed.  Constitutional:      Appearance: Normal appearance. She is well-developed.  HENT:     Head: Normocephalic and atraumatic.     Right Ear: External ear normal.     Left Ear: External ear normal.     Nose: Nose normal.     Mouth/Throat:     Mouth: Mucous membranes are moist.     Pharynx: Oropharynx is clear.  Eyes:     Conjunctiva/sclera: Conjunctivae normal.  Cardiovascular:     Rate and Rhythm: Normal rate and regular rhythm.     Heart sounds: Normal heart sounds. No murmur heard.   No friction rub. No gallop.  Pulmonary:     Effort: Pulmonary effort is normal.     Breath sounds: Normal breath sounds. No wheezing, rhonchi or rales.  Abdominal:     Palpations: Abdomen is soft.  Musculoskeletal:     Cervical back: Neck supple.  Skin:    General: Skin is warm.     Findings: No rash.  Neurological:     Mental Status: She is alert and oriented to person, place, and time.  Psychiatric:        Behavior: Behavior normal.  The plan was reviewed with the patient/family, and  all questions/concerned were addressed.  It was my pleasure to see Hailey Beltran today and participate in her care. Please feel free to contact me with any questions or concerns.  Sincerely,  Wyline Mood, DO Allergy & Immunology  [facility] office: [phone] [facility] office: [phone]

## 2021-03-06 ENCOUNTER — Ambulatory Visit: Payer: BC Managed Care – PPO | Admitting: Allergy

## 2021-03-06 ENCOUNTER — Encounter: Payer: Self-pay | Admitting: Allergy

## 2021-03-06 ENCOUNTER — Other Ambulatory Visit: Payer: Self-pay

## 2021-03-06 VITALS — BP 110/82 | HR 102 | Temp 98.1°F | Resp 16 | Ht 61.0 in | Wt 236.4 lb

## 2021-03-06 DIAGNOSIS — H1013 Acute atopic conjunctivitis, bilateral: Secondary | ICD-10-CM | POA: Diagnosis not present

## 2021-03-06 DIAGNOSIS — T781XXD Other adverse food reactions, not elsewhere classified, subsequent encounter: Secondary | ICD-10-CM

## 2021-03-06 DIAGNOSIS — J3089 Other allergic rhinitis: Secondary | ICD-10-CM | POA: Diagnosis not present

## 2021-03-06 NOTE — Patient Instructions (Addendum)
Today's skin testing showed: Positive to grass, weed, ragweed, trees, mold, dust mites, cat, dog, horse.  Positive to pecan, almond and borderline to hazelnut. Negative to peanuts, banana and watermelon. Results given.  Environmental allergies Start environmental control measures as below. Use over the counter antihistamines such as Zyrtec (cetirizine), Claritin (loratadine), Allegra (fexofenadine), or Xyzal (levocetirizine) daily as needed. May take twice a day during allergy flares. May switch antihistamines every few months. Use Nasonex (mometasone) nasal spray 1 spray per nostril twice a day as needed for nasal congestion.  Nasal saline spray (i.e., Simply Saline) or nasal saline lavage (i.e., NeilMed) is recommended as needed and prior to medicated nasal sprays. Consider allergy injections for long term control if above medications do not help the symptoms - handout given.   Food: Continue to avoid tree nuts Walnuts and pecans cross react. Okay to eat peanuts and cashews as before - be careful about cross contamination.  For mild symptoms you can take over the counter antihistamines such as Benadryl and monitor symptoms closely. If symptoms worsen or if you have severe symptoms including breathing issues, throat closure, significant swelling, whole body hives, severe diarrhea and vomiting, lightheadedness then inject epinephrine and seek immediate medical care afterwards. Action plan given.   Bananas and watermelon:  Continue to avoid fresh bananas and fresh watermelon.  Discussed that her food triggered oral and throat symptoms are likely caused by oral food allergy syndrome (OFAS). This is caused by cross reactivity of pollen with fresh fruits and vegetables, and nuts. Symptoms are usually localized in the form of itching and burning in mouth and throat. Very rarely it can progress to more severe symptoms. Eating foods in cooked or processed forms usually minimizes symptoms. I  recommended avoidance of eating the problem foods, especially during the peak season(s). Sometimes, OFAS can induce severe throat swelling or even a systemic reaction; with such instance, I advised them to report to a local ER. A list of common pollens and food cross-reactivities was provided to the patient.   Follow up in 9 months or sooner if needed.   Reducing Pollen Exposure Pollen seasons: trees (spring), grass (summer) and ragweed/weeds (fall). Keep windows closed in your home and car to lower pollen exposure.  Install air conditioning in the bedroom and throughout the house if possible.  Avoid going out in dry windy days - especially early morning. Pollen counts are highest between 5 - 10 AM and on dry, hot and windy days.  Save outside activities for late afternoon or after a heavy rain, when pollen levels are lower.  Avoid mowing of grass if you have grass pollen allergy. Be aware that pollen can also be transported indoors on people and pets.  Dry your clothes in an automatic dryer rather than hanging them outside where they might collect pollen.  Rinse hair and eyes before bedtime. Mold Control Mold and fungi can grow on a variety of surfaces provided certain temperature and moisture conditions exist.  Outdoor molds grow on plants, decaying vegetation and soil. The major outdoor mold, Alternaria and Cladosporium, are found in very high numbers during hot and dry conditions. Generally, a late summer - fall peak is seen for common outdoor fungal spores. Rain will temporarily lower outdoor mold spore count, but counts rise rapidly when the rainy period ends. The most important indoor molds are Aspergillus and Penicillium. Dark, humid and poorly ventilated basements are ideal sites for mold growth. The next most common sites of mold growth are the  bathroom and the kitchen. Outdoor (Seasonal) Mold Control Use air conditioning and keep windows closed. Avoid exposure to decaying  vegetation. Avoid leaf raking. Avoid grain handling. Consider wearing a face mask if working in moldy areas.  Indoor (Perennial) Mold Control  Maintain humidity below 50%. Get rid of mold growth on hard surfaces with water, detergent and, if necessary, 5% bleach (do not mix with other cleaners). Then dry the area completely. If mold covers an area more than 10 square feet, consider hiring an indoor environmental professional. For clothing, washing with soap and water is best. If moldy items cannot be cleaned and dried, throw them away. Remove sources e.g. contaminated carpets. Repair and seal leaking roofs or pipes. Using dehumidifiers in damp basements may be helpful, but empty the water and clean units regularly to prevent mildew from forming. All rooms, especially basements, bathrooms and kitchens, require ventilation and cleaning to deter mold and mildew growth. Avoid carpeting on concrete or damp floors, and storing items in damp areas. Control of House Dust Mite Allergen Dust mite allergens are a common trigger of allergy and asthma symptoms. While they can be found throughout the house, these microscopic creatures thrive in warm, humid environments such as bedding, upholstered furniture and carpeting. Because so much time is spent in the bedroom, it is essential to reduce mite levels there.  Encase pillows, mattresses, and box springs in special allergen-proof fabric covers or airtight, zippered plastic covers.  Bedding should be washed weekly in hot water (130 F) and dried in a hot dryer. Allergen-proof covers are available for comforters and pillows that can't be regularly washed.  Wash the allergy-proof covers every few months. Minimize clutter in the bedroom. Keep pets out of the bedroom.  Keep humidity less than 50% by using a dehumidifier or air conditioning. You can buy a humidity measuring device called a hygrometer to monitor this.  If possible, replace carpets with hardwood,  linoleum, or washable area rugs. If that's not possible, vacuum frequently with a vacuum that has a HEPA filter. Remove all upholstered furniture and non-washable window drapes from the bedroom. Remove all non-washable stuffed toys from the bedroom.  Wash stuffed toys weekly. Pet Allergen Avoidance: Contrary to popular opinion, there are no "hypoallergenic" breeds of dogs or cats. That is because people are not allergic to an animal's hair, but to an allergen found in the animal's saliva, dander (dead skin flakes) or urine. Pet allergy symptoms typically occur within minutes. For some people, symptoms can build up and become most severe 8 to 12 hours after contact with the animal. People with severe allergies can experience reactions in public places if dander has been transported on the pet owners' clothing. Keeping an animal outdoors is only a partial solution, since homes with pets in the yard still have higher concentrations of animal allergens. Before getting a pet, ask your allergist to determine if you are allergic to animals. If your pet is already considered part of your family, try to minimize contact and keep the pet out of the bedroom and other rooms where you spend a great deal of time. As with dust mites, vacuum carpets often or replace carpet with a hardwood floor, tile or linoleum. High-efficiency particulate air (HEPA) cleaners can reduce allergen levels over time. While dander and saliva are the source of cat and dog allergens, urine is the source of allergens from rabbits, hamsters, mice and [place] pigs; so ask a non-allergic family member to clean the animal's cage. If you  have a pet allergy, talk to your allergist about the potential for allergy immunotherapy (allergy shots). This strategy can often provide long-term relief.

## 2021-03-07 DIAGNOSIS — T781XXD Other adverse food reactions, not elsewhere classified, subsequent encounter: Secondary | ICD-10-CM | POA: Insufficient documentation

## 2021-03-07 DIAGNOSIS — T7819XD Other adverse food reactions, not elsewhere classified, subsequent encounter: Secondary | ICD-10-CM | POA: Insufficient documentation

## 2021-03-07 DIAGNOSIS — J3089 Other allergic rhinitis: Secondary | ICD-10-CM | POA: Insufficient documentation

## 2021-03-07 DIAGNOSIS — H1013 Acute atopic conjunctivitis, bilateral: Secondary | ICD-10-CM | POA: Insufficient documentation

## 2021-03-07 NOTE — Assessment & Plan Note (Signed)
Perioral pruritus with watermelon and bananas. . Today's skin prick testing was negative to bananas and watermelon. . Continue to avoid fresh bananas and fresh watermelon.  . Discussed that her food triggered oral and throat symptoms are likely caused by oral food allergy syndrome (OFAS). This is caused by cross reactivity of pollen with fresh fruits and vegetables, and nuts. Symptoms are usually localized in the form of itching and burning in mouth and throat. Very rarely it can progress to more severe symptoms. Eating foods in cooked or processed forms usually minimizes symptoms. I recommended avoidance of eating the problem foods, especially during the peak season(s). Sometimes, OFAS can induce severe throat swelling or even a systemic reaction; with such instance, I advised them to report to a local ER. A list of common pollens and food cross-reactivities was provided to the patient.

## 2021-03-07 NOTE — Assessment & Plan Note (Signed)
.   See assessment and plan as above. 

## 2021-03-07 NOTE — Assessment & Plan Note (Signed)
Rhinoconjunctivitis symptoms mainly in the spring for 10+ years.  Tried loratadine, Zyrtec and mometasone with good benefit.  No prior allergy evaluation.  Today's skin testing showed: Positive to grass, weed, ragweed, trees, mold, dust mites, cat, dog, horse.   Start environmental control measures as below.  Use over the counter antihistamines such as Zyrtec (cetirizine), Claritin (loratadine), Allegra (fexofenadine), or Xyzal (levocetirizine) daily as needed. May take twice a day during allergy flares. May switch antihistamines every few months.  Use Nasonex (mometasone) nasal spray 1 spray per nostril twice a day as needed for nasal congestion.   Nasal saline spray (i.e., Simply Saline) or nasal saline lavage (i.e., NeilMed) is recommended as needed and prior to medicated nasal sprays.  Declines prescription eyedrops.  Consider allergy injections for long term control if above medications do not help the symptoms - handout given.

## 2021-03-07 NOTE — Assessment & Plan Note (Signed)
Reaction to pecans many years ago in the form of perioral pruritus and throat tightness.  Took Benadryl at home and symptoms resolved.  Patient tolerates cashews and peanuts.  She had peanuts a few weeks ago with no issues.  Watermelon and bananas caused some perioral pruritus.  No prior work-up.  Today's skin testing showed: Positive to pecan, almond and borderline to hazelnut. Negative to peanuts, banana and watermelon. . Discussed with patient the difference between peanuts and tree nuts and she confirmed that she has been eating peanuts with no issues.  . Continue to avoid tree nuts o Walnuts and pecans cross react. o Okay to eat peanuts and cashews as before - be careful about cross contamination.  . For mild symptoms you can take over the counter antihistamines such as Benadryl and monitor symptoms closely. If symptoms worsen or if you have severe symptoms including breathing issues, throat closure, significant swelling, whole body hives, severe diarrhea and vomiting, lightheadedness then inject epinephrine and seek immediate medical care afterwards. . Action plan given.

## 2021-03-22 ENCOUNTER — Encounter: Payer: Self-pay | Admitting: Allergy

## 2021-03-28 DIAGNOSIS — D649 Anemia, unspecified: Secondary | ICD-10-CM | POA: Diagnosis not present

## 2021-03-28 DIAGNOSIS — J302 Other seasonal allergic rhinitis: Secondary | ICD-10-CM | POA: Diagnosis not present

## 2021-03-28 DIAGNOSIS — Z91018 Allergy to other foods: Secondary | ICD-10-CM | POA: Diagnosis not present

## 2021-04-15 DIAGNOSIS — J309 Allergic rhinitis, unspecified: Secondary | ICD-10-CM | POA: Diagnosis not present

## 2021-04-15 DIAGNOSIS — H9313 Tinnitus, bilateral: Secondary | ICD-10-CM | POA: Diagnosis not present

## 2021-04-15 DIAGNOSIS — J343 Hypertrophy of nasal turbinates: Secondary | ICD-10-CM | POA: Diagnosis not present

## 2021-05-25 DIAGNOSIS — H60392 Other infective otitis externa, left ear: Secondary | ICD-10-CM | POA: Diagnosis not present

## 2021-07-05 DIAGNOSIS — Z03818 Encounter for observation for suspected exposure to other biological agents ruled out: Secondary | ICD-10-CM | POA: Diagnosis not present

## 2021-07-05 DIAGNOSIS — R5383 Other fatigue: Secondary | ICD-10-CM | POA: Diagnosis not present

## 2021-07-05 DIAGNOSIS — R519 Headache, unspecified: Secondary | ICD-10-CM | POA: Diagnosis not present

## 2021-07-05 DIAGNOSIS — R52 Pain, unspecified: Secondary | ICD-10-CM | POA: Diagnosis not present

## 2021-07-06 IMAGING — CR DG CHEST 2V
2 series · 2 of 2 positions shown · non-contrast
Comparison: None.

CLINICAL DATA: Intermittent midline chest pain for several months

EXAM:
CHEST - 2 VIEW

[w chest lat]
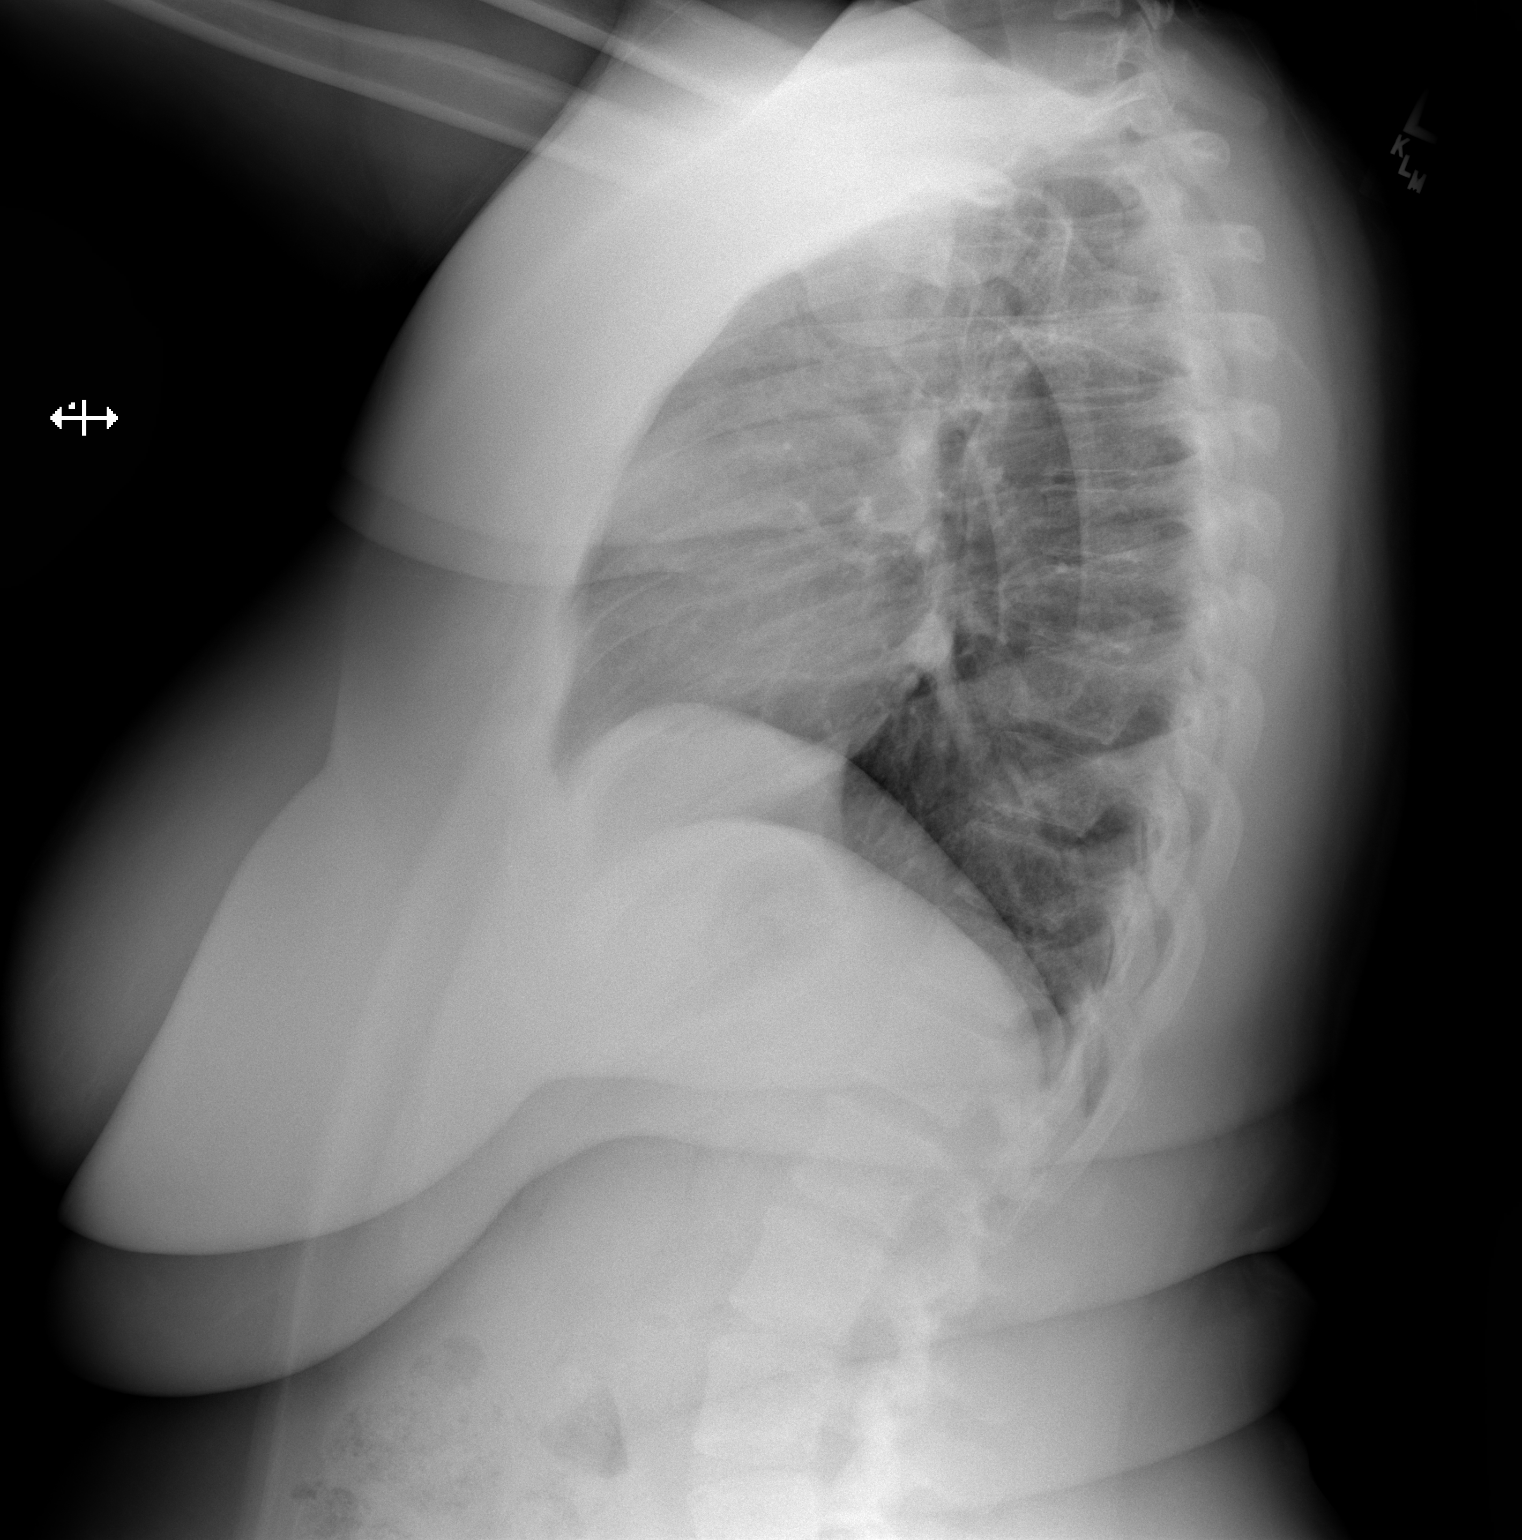

[w chest pa]
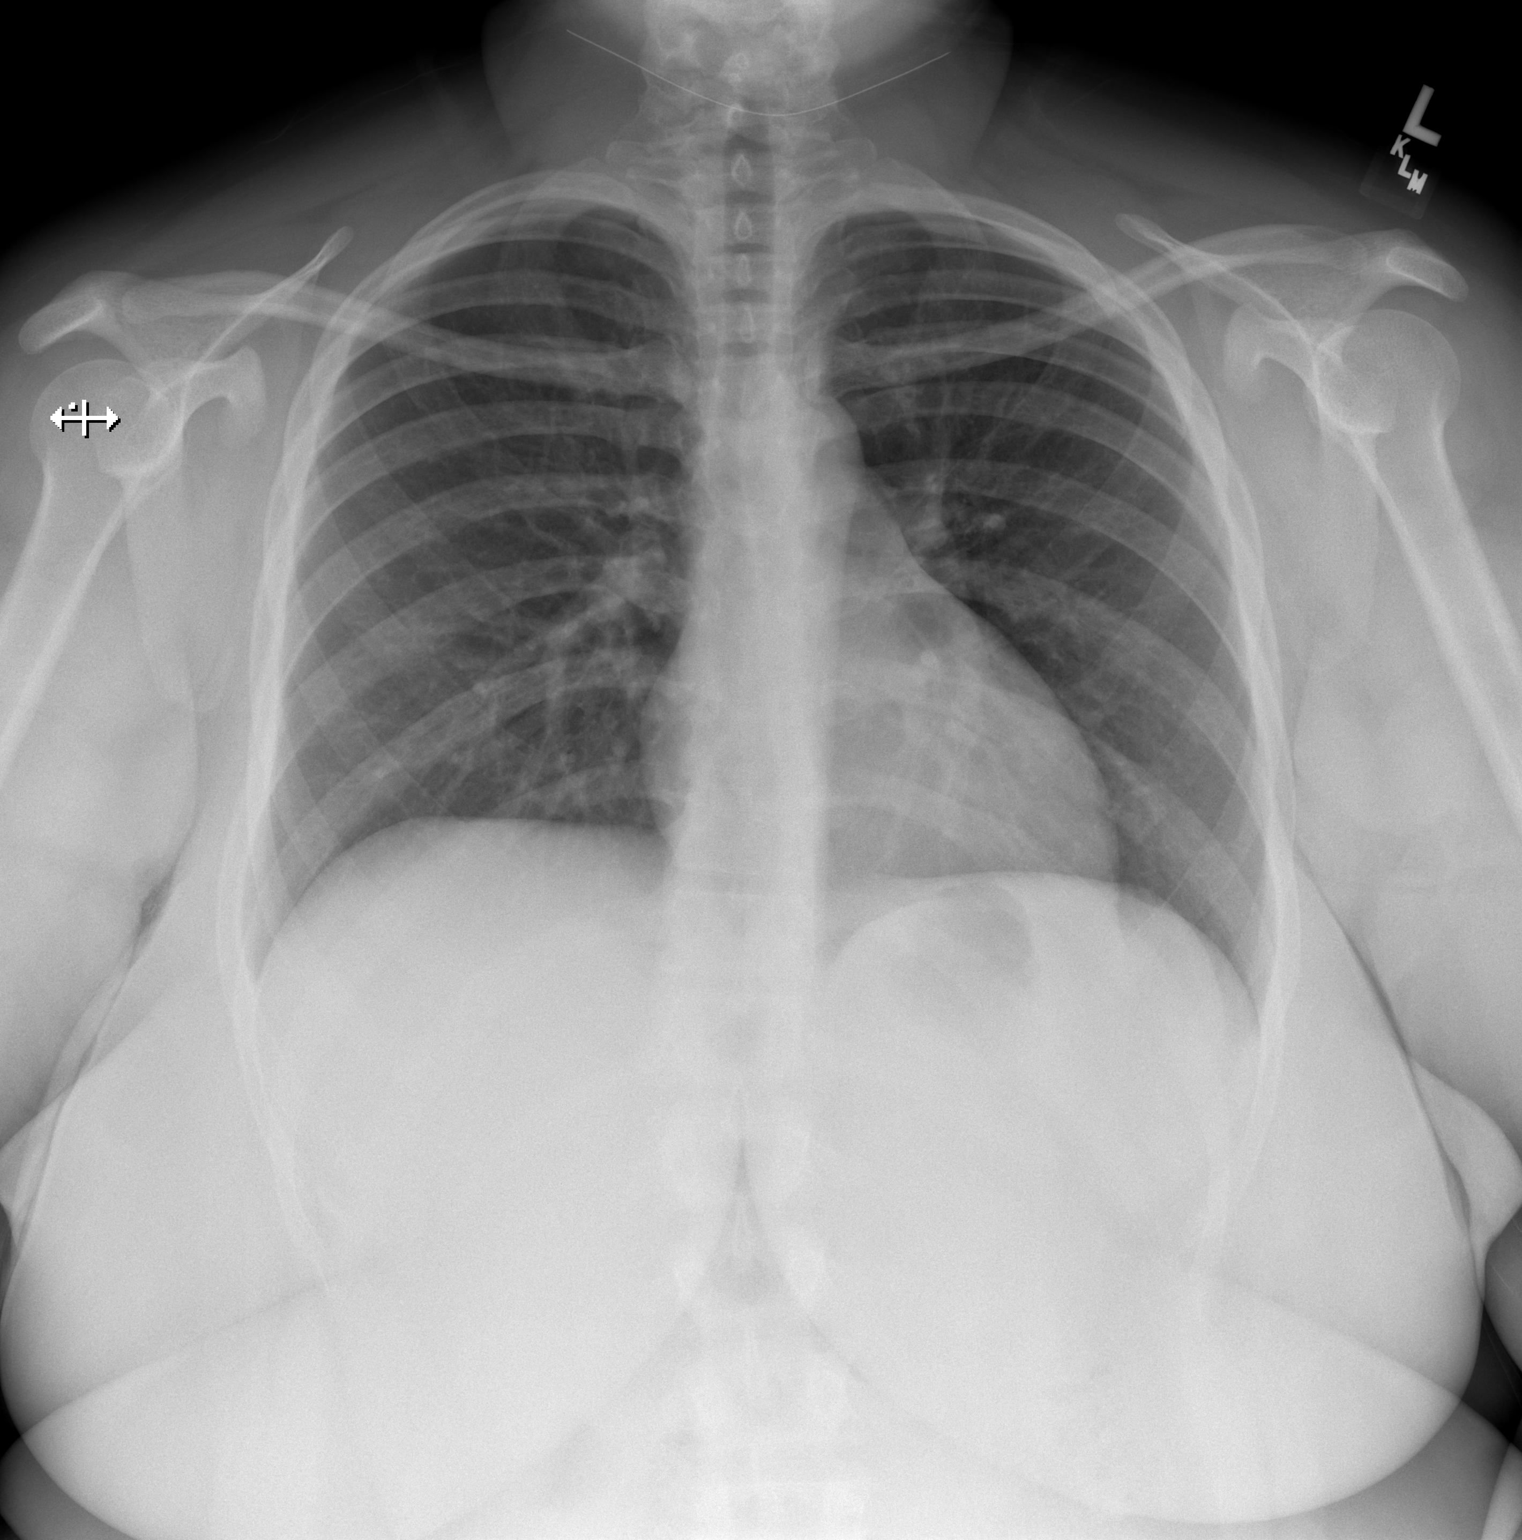

[2 of 2 positions shown; findings below may reference images not displayed]

FINDINGS: The heart size and mediastinal contours are within normal limits.
Both lungs are clear. The visualized skeletal structures are
unremarkable.
IMPRESSION: No active cardiopulmonary disease.

## 2021-08-12 DIAGNOSIS — Z03818 Encounter for observation for suspected exposure to other biological agents ruled out: Secondary | ICD-10-CM | POA: Diagnosis not present

## 2021-08-12 DIAGNOSIS — B349 Viral infection, unspecified: Secondary | ICD-10-CM | POA: Diagnosis not present

## 2021-11-13 DIAGNOSIS — J029 Acute pharyngitis, unspecified: Secondary | ICD-10-CM | POA: Diagnosis not present

## 2021-11-13 DIAGNOSIS — R5383 Other fatigue: Secondary | ICD-10-CM | POA: Diagnosis not present

## 2021-11-13 DIAGNOSIS — R52 Pain, unspecified: Secondary | ICD-10-CM | POA: Diagnosis not present

## 2021-11-13 DIAGNOSIS — R059 Cough, unspecified: Secondary | ICD-10-CM | POA: Diagnosis not present

## 2021-11-13 DIAGNOSIS — Z03818 Encounter for observation for suspected exposure to other biological agents ruled out: Secondary | ICD-10-CM | POA: Diagnosis not present

## 2021-11-13 DIAGNOSIS — R0981 Nasal congestion: Secondary | ICD-10-CM | POA: Diagnosis not present

## 2021-12-16 ENCOUNTER — Ambulatory Visit: Payer: BC Managed Care – PPO | Admitting: Allergy

## 2021-12-31 DIAGNOSIS — Z113 Encounter for screening for infections with a predominantly sexual mode of transmission: Secondary | ICD-10-CM | POA: Diagnosis not present

## 2021-12-31 DIAGNOSIS — D649 Anemia, unspecified: Secondary | ICD-10-CM | POA: Diagnosis not present

## 2021-12-31 DIAGNOSIS — Z0000 Encounter for general adult medical examination without abnormal findings: Secondary | ICD-10-CM | POA: Diagnosis not present

## 2021-12-31 DIAGNOSIS — Z1159 Encounter for screening for other viral diseases: Secondary | ICD-10-CM | POA: Diagnosis not present

## 2021-12-31 DIAGNOSIS — Z13220 Encounter for screening for lipoid disorders: Secondary | ICD-10-CM | POA: Diagnosis not present

## 2022-07-27 DIAGNOSIS — R509 Fever, unspecified: Secondary | ICD-10-CM | POA: Diagnosis not present

## 2022-07-27 DIAGNOSIS — Z03818 Encounter for observation for suspected exposure to other biological agents ruled out: Secondary | ICD-10-CM | POA: Diagnosis not present

## 2022-07-27 DIAGNOSIS — R112 Nausea with vomiting, unspecified: Secondary | ICD-10-CM | POA: Diagnosis not present

## 2022-07-27 DIAGNOSIS — R52 Pain, unspecified: Secondary | ICD-10-CM | POA: Diagnosis not present

## 2022-07-27 DIAGNOSIS — R059 Cough, unspecified: Secondary | ICD-10-CM | POA: Diagnosis not present

## 2022-07-30 DIAGNOSIS — J0100 Acute maxillary sinusitis, unspecified: Secondary | ICD-10-CM | POA: Diagnosis not present

## 2022-07-30 DIAGNOSIS — R058 Other specified cough: Secondary | ICD-10-CM | POA: Diagnosis not present

## 2022-10-26 DIAGNOSIS — R051 Acute cough: Secondary | ICD-10-CM | POA: Diagnosis not present

## 2022-10-26 DIAGNOSIS — R509 Fever, unspecified: Secondary | ICD-10-CM | POA: Diagnosis not present

## 2022-10-26 DIAGNOSIS — R5383 Other fatigue: Secondary | ICD-10-CM | POA: Diagnosis not present

## 2022-10-26 DIAGNOSIS — U071 COVID-19: Secondary | ICD-10-CM | POA: Diagnosis not present

## 2023-01-12 DIAGNOSIS — M25562 Pain in left knee: Secondary | ICD-10-CM | POA: Diagnosis not present

## 2023-01-20 DIAGNOSIS — Z0000 Encounter for general adult medical examination without abnormal findings: Secondary | ICD-10-CM | POA: Diagnosis not present

## 2023-01-20 DIAGNOSIS — M25562 Pain in left knee: Secondary | ICD-10-CM | POA: Diagnosis not present

## 2023-01-20 DIAGNOSIS — G43009 Migraine without aura, not intractable, without status migrainosus: Secondary | ICD-10-CM | POA: Diagnosis not present

## 2023-01-20 DIAGNOSIS — Z13220 Encounter for screening for lipoid disorders: Secondary | ICD-10-CM | POA: Diagnosis not present

## 2023-01-20 DIAGNOSIS — E669 Obesity, unspecified: Secondary | ICD-10-CM | POA: Diagnosis not present

## 2023-02-05 ENCOUNTER — Ambulatory Visit: Payer: BC Managed Care – PPO | Admitting: Sports Medicine

## 2023-02-05 VITALS — BP 134/80 | HR 100 | Ht 61.0 in | Wt 206.0 lb

## 2023-02-05 DIAGNOSIS — M25562 Pain in left knee: Secondary | ICD-10-CM | POA: Diagnosis not present

## 2023-02-05 MED ORDER — MELOXICAM 15 MG PO TABS: 15.0000 mg | ORAL_TABLET | Freq: Every day | ORAL | 0 refills | Status: AC

## 2023-02-05 NOTE — Patient Instructions (Addendum)
Knee HEP  - Start meloxicam 15 mg daily x2 weeks.  If still having pain after 2 weeks, complete 3rd-week of meloxicam. May use remaining meloxicam as needed once daily for pain control.  Do not to use additional NSAIDs while taking meloxicam.  May use Tylenol [phone] mg 2 to 3 times a day for breakthrough pain. 4 week follow up

## 2023-02-05 NOTE — Progress Notes (Signed)
    Hailey Beltran D.[facility] [address] [place] [address] Phone: [phone]   Assessment and Plan:     1. Acute pain of left knee -Acute, uncomplicated, initial sports medicine visit - Unclear etiology of 1 month of left knee pain.  Suspect patellofemoral dysfunction likely occurring due to increased physical therapy.  No red flags on physical exam, so no imaging at today's visit - Start meloxicam 15 mg daily x2 weeks.  If still having pain after 2 weeks, complete 3rd-week of meloxicam. May use remaining meloxicam as needed once daily for pain control.  Do not to use additional NSAIDs while taking meloxicam.  May use Tylenol [phone] mg 2 to 3 times a day for breakthrough pain. - Start HEP for knee - Recommend activity modification with gradual reintroduction into exercises as tolerated  Other orders - meloxicam (MOBIC) 15 MG tablet; Take 1 tablet (15 mg total) by mouth daily.    Pertinent previous records reviewed include none   Follow Up: 4 weeks for reevaluation.  Could consider x-ray imaging versus CSI versus physical therapy based on presentation   Subjective:   I, Hailey Beltran, am serving as a Neurosurgeon for Doctor Richardean Sale  Chief Complaint: left knee pain   HPI:   02/05/23 Patient is a 27 year old female complaining of left knee pain. Patient states she started working out beginning of the year ,pain stated a month ago was seen by pcp and was referred to PT and they never called, medial knee pain, thinks its from over use of elliptical . She sits for work and will feel a pressure that needs to pop. No meds for the pain , no radiating pain , no numbness or tingling. Would be interested in going to PT   Relevant Historical Information: None pertinent  Additional pertinent review of systems negative.   Current Outpatient Medications:    EPINEPHrine 0.3 mg/0.3 mL IJ SOAJ injection, Inject 0.3 mg into the muscle once., Disp:  , Rfl:    ferrous sulfate 325 (65 FE) MG EC tablet, Take 1 tablet by mouth 2 (two) times daily., Disp: , Rfl:    loratadine (CLARITIN) 10 MG tablet, Take 10 mg by mouth daily., Disp: , Rfl:    meloxicam (MOBIC) 15 MG tablet, Take 1 tablet (15 mg total) by mouth daily., Disp: 30 tablet, Rfl: 0   Objective:     Vitals:   02/05/23 1538  BP: 134/80  Pulse: 100  SpO2: 100%  Weight: 206 lb (93.4 kg)  Height: 5\' 1"  (1.549 m)      Body mass index is 38.92 kg/m.    Physical Exam:    General:  awake, alert oriented, no acute distress nontoxic Skin: no suspicious lesions or rashes Neuro:sensation intact and strength 5/5 with no deficits, no atrophy, normal muscle tone Psych: No signs of anxiety, depression or other mood disorder  Left knee: No swelling No deformity Neg fluid wave, joint milking ROM Flex 110, Ext 0 TTP medial femoral condyle, medial joint line NTTP over the quad tendon,  lat fem condyle, patella, plica, patella tendon, tibial tuberostiy, fibular head, posterior fossa, pes anserine bursa, gerdy's tubercle,   lateral jt line Neg anterior and posterior drawer Neg lachman Neg sag sign Negative varus stress Negative valgus stress Negative McMurray Negative Thessaly  Gait normal    Electronically signed by:  Hailey Beltran [facility] 4:10 PM 02/05/23

## 2023-03-04 ENCOUNTER — Other Ambulatory Visit: Payer: Self-pay | Admitting: Sports Medicine

## 2023-03-11 NOTE — Progress Notes (Unsigned)
    Hailey Beltran D.[facility] [address] [place] [address] Phone: [phone]   Assessment and Plan:     There are no diagnoses linked to this encounter.  ***   Pertinent previous records reviewed include ***   Follow Up: ***     Subjective:   I, Hailey Beltran, am serving as a Neurosurgeon for Doctor Richardean Sale   Chief Complaint: left knee pain    HPI:    02/05/23 Patient is a 27 year old female complaining of left knee pain. Patient states she started working out beginning of the year ,pain stated a month ago was seen by pcp and was referred to PT and they never called, medial knee pain, thinks its from over use of elliptical . She sits for work and will feel a pressure that needs to pop. No meds for the pain , no radiating pain , no numbness or tingling. Would be interested in going to PT   03/12/2023 Patient states    Relevant Historical Information: None pertinent  Additional pertinent review of systems negative.   Current Outpatient Medications:    EPINEPHrine 0.3 mg/0.3 mL IJ SOAJ injection, Inject 0.3 mg into the muscle once., Disp: , Rfl:    ferrous sulfate 325 (65 FE) MG EC tablet, Take 1 tablet by mouth 2 (two) times daily., Disp: , Rfl:    loratadine (CLARITIN) 10 MG tablet, Take 10 mg by mouth daily., Disp: , Rfl:    meloxicam (MOBIC) 15 MG tablet, Take 1 tablet (15 mg total) by mouth daily., Disp: 30 tablet, Rfl: 0   Objective:     There were no vitals filed for this visit.    There is no height or weight on file to calculate BMI.    Physical Exam:    ***   Electronically signed by:  Hailey Beltran [facility] 7:20 AM 03/11/23

## 2023-03-12 ENCOUNTER — Ambulatory Visit: Payer: BC Managed Care – PPO | Admitting: Sports Medicine

## 2023-03-12 VITALS — HR 91 | Ht 61.0 in | Wt 200.0 lb

## 2023-03-12 DIAGNOSIS — M25562 Pain in left knee: Secondary | ICD-10-CM | POA: Diagnosis not present

## 2023-05-12 DIAGNOSIS — D649 Anemia, unspecified: Secondary | ICD-10-CM | POA: Diagnosis not present

## 2023-07-23 DIAGNOSIS — R051 Acute cough: Secondary | ICD-10-CM | POA: Diagnosis not present

## 2023-07-23 DIAGNOSIS — R0981 Nasal congestion: Secondary | ICD-10-CM | POA: Diagnosis not present

## 2023-07-23 DIAGNOSIS — H6501 Acute serous otitis media, right ear: Secondary | ICD-10-CM | POA: Diagnosis not present

## 2023-07-23 DIAGNOSIS — Z03818 Encounter for observation for suspected exposure to other biological agents ruled out: Secondary | ICD-10-CM | POA: Diagnosis not present

## 2023-07-23 DIAGNOSIS — H66002 Acute suppurative otitis media without spontaneous rupture of ear drum, left ear: Secondary | ICD-10-CM | POA: Diagnosis not present

## 2023-08-02 DIAGNOSIS — W228XXA Striking against or struck by other objects, initial encounter: Secondary | ICD-10-CM | POA: Diagnosis not present

## 2023-08-02 DIAGNOSIS — Z23 Encounter for immunization: Secondary | ICD-10-CM | POA: Diagnosis not present

## 2023-08-02 DIAGNOSIS — S0181XA Laceration without foreign body of other part of head, initial encounter: Secondary | ICD-10-CM | POA: Diagnosis not present

## 2023-09-11 DIAGNOSIS — Z79899 Other long term (current) drug therapy: Secondary | ICD-10-CM | POA: Diagnosis not present

## 2023-10-26 DIAGNOSIS — R52 Pain, unspecified: Secondary | ICD-10-CM | POA: Diagnosis not present

## 2023-10-26 DIAGNOSIS — R6883 Chills (without fever): Secondary | ICD-10-CM | POA: Diagnosis not present

## 2023-10-26 DIAGNOSIS — Z03818 Encounter for observation for suspected exposure to other biological agents ruled out: Secondary | ICD-10-CM | POA: Diagnosis not present

## 2023-10-26 DIAGNOSIS — R5383 Other fatigue: Secondary | ICD-10-CM | POA: Diagnosis not present

## 2024-02-16 DIAGNOSIS — Z0000 Encounter for general adult medical examination without abnormal findings: Secondary | ICD-10-CM | POA: Diagnosis not present

## 2024-02-16 DIAGNOSIS — Z13220 Encounter for screening for lipoid disorders: Secondary | ICD-10-CM | POA: Diagnosis not present

## 2024-02-28 DIAGNOSIS — J209 Acute bronchitis, unspecified: Secondary | ICD-10-CM | POA: Diagnosis not present

## 2024-02-28 DIAGNOSIS — R059 Cough, unspecified: Secondary | ICD-10-CM | POA: Diagnosis not present

## 2024-02-28 DIAGNOSIS — Z20822 Contact with and (suspected) exposure to covid-19: Secondary | ICD-10-CM | POA: Diagnosis not present

## 2024-02-28 DIAGNOSIS — J029 Acute pharyngitis, unspecified: Secondary | ICD-10-CM | POA: Diagnosis not present

## 2024-06-06 NOTE — Progress Notes (Unsigned)
 [facility] [address] [place] [address] Phone: [phone]   Assessment and Plan:     1. Acute right ankle pain (Primary) -Acute, initial visit - Most consistent with strain of right foot dorsiflexors without specific MOI - X-ray obtained in clinic.  My interpretation: No acute fracture or dislocation - May continue to use a lace up ankle brace as needed for 1 to 2 weeks to decrease pain when physically active - Start meloxicam  15 mg daily x2 weeks.  If still having pain after 2 weeks, complete 3rd-week of NSAID. May use remaining NSAID as needed once daily for pain control.  Do not to use additional over-the-counter NSAIDs (ibuprofen , naproxen, Advil , Aleve, etc.) while taking prescription NSAIDs.  May use Tylenol  [phone] mg 2 to 3 times a day for breakthrough pain. - Start HEP for ankle  15 additional minutes spent for educating Therapeutic Home Exercise Program.  This included exercises focusing on stretching, strengthening, with focus on eccentric aspects.   Long term goals include an improvement in range of motion, strength, endurance as well as avoiding reinjury. Patient's frequency would include in 1-2 times a day, 3-5 times a week for a duration of 6-12 weeks. Proper technique shown and discussed handout in great detail with ATC.  All questions were discussed and answered.      Pertinent previous records reviewed include none   Follow Up: As needed if no improvement in 3 to 4 weeks.  Could discuss ultrasound versus physical therapy versus prednisone course.  Could further evaluate ongoing hip pain if symptoms remain   Subjective:   I, Hailey Beltran am a scribe for Dr. Leonce.    Chief Complaint: ankle pain   HPI:   06/07/2024 Patient is a 28 year old female with ankle pain. Patient states that it has been going on for about a week now. Pain shoots from toes to mid calf. Can not turn foot to the right. It is  very painful to bare weight on that foot. It affects walking. No accident or injury known. Not taking any medications at this time. Just started wearing a brace on Sunday. Pain does not wake her up at night.    Relevant Historical Information: Sprained ankles in 2007  Additional pertinent review of systems negative.   Current Outpatient Medications:    EPINEPHrine 0.3 mg/0.3 mL IJ SOAJ injection, Inject 0.3 mg into the muscle once., Disp: , Rfl:    loratadine (CLARITIN) 10 MG tablet, Take 10 mg by mouth daily., Disp: , Rfl:    meloxicam  (MOBIC ) 15 MG tablet, Take 1 tablet (15 mg total) by mouth daily., Disp: 30 tablet, Rfl: 0   ferrous sulfate 325 (65 FE) MG EC tablet, Take 1 tablet by mouth 2 (two) times daily., Disp: , Rfl:    meloxicam  (MOBIC ) 15 MG tablet, Take 1 tablet (15 mg total) by mouth daily., Disp: 30 tablet, Rfl: 0   Objective:     Vitals:   06/07/24 1046  BP: 110/70  Pulse: 87  SpO2: 98%  Weight: 181 lb (82.1 kg)  Height: 5' 1 (1.549 m)      Body mass index is 34.2 kg/m.    Physical Exam:    Gen: Appears well, nad, nontoxic and pleasant Psych: Alert and oriented, appropriate mood and affect Neuro: sensation intact, strength is 5/5 with df/pf/inv/ev, muscle tone wnl Skin: no susupicious lesions or rashes  left Ankle:  No deformity, no swelling or  effusion TTP ATFL, CFL, distal lateral fibula NTTP over fibular head, lat mal, medial mal, achilles, navicular, base of 5th,   deltoid, calcaneous or midfoot ROM DF 30, PF 45, inv/ev intact Negative ant drawer, talar tilt, rotation test, squeeze test. Neg Ditton Mild pain with resisted inversion, eversion, dorsiflexion No pain with resisted plantarflexion    Electronically signed by:  Hailey Beltran D.[facility] 11:54 AM 06/07/24

## 2024-06-07 ENCOUNTER — Ambulatory Visit (INDEPENDENT_AMBULATORY_CARE_PROVIDER_SITE_OTHER)

## 2024-06-07 ENCOUNTER — Ambulatory Visit: Admitting: Sports Medicine

## 2024-06-07 VITALS — BP 110/70 | HR 87 | Ht 61.0 in | Wt 181.0 lb

## 2024-06-07 DIAGNOSIS — M25571 Pain in right ankle and joints of right foot: Secondary | ICD-10-CM | POA: Diagnosis not present

## 2024-06-07 MED ORDER — MELOXICAM 15 MG PO TABS: 15.0000 mg | ORAL_TABLET | Freq: Every day | ORAL | 0 refills | Status: AC

## 2024-06-07 NOTE — Patient Instructions (Signed)
-   Start meloxicam  15 mg daily x2 weeks.  If still having pain after 2 weeks, complete 3rd-week of NSAID. May use remaining NSAID as needed once daily for pain control.  Do not to use additional over-the-counter NSAIDs (ibuprofen , naproxen, Advil , Aleve, etc.) while taking prescription NSAIDs.  May use Tylenol  [phone] mg 2 to 3 times a day for breakthrough pain.  Hep for ankle Use ankle brace for the next 1 to 2 weeks. Follow up as needed. If no improvement the follow up in 3 to 4 weeks.

## 2024-06-13 ENCOUNTER — Ambulatory Visit: Payer: Self-pay | Admitting: Sports Medicine

## 2024-07-26 DIAGNOSIS — J029 Acute pharyngitis, unspecified: Secondary | ICD-10-CM | POA: Diagnosis not present

## 2024-10-19 DIAGNOSIS — H6593 Unspecified nonsuppurative otitis media, bilateral: Secondary | ICD-10-CM | POA: Diagnosis not present

## 2024-10-19 DIAGNOSIS — R519 Headache, unspecified: Secondary | ICD-10-CM | POA: Diagnosis not present

## 2024-10-19 DIAGNOSIS — R030 Elevated blood-pressure reading, without diagnosis of hypertension: Secondary | ICD-10-CM | POA: Diagnosis not present
